# Patient Record
Sex: Female | Born: 1990 | Race: Black or African American | Hispanic: No | Marital: Single | Smoking: Current every day smoker
Health system: Southern US, Community
[De-identification: ages and names within clinical notes are randomized; demographics above are authoritative.]

## PROBLEM LIST (undated history)

## (undated) DIAGNOSIS — I10 Essential (primary) hypertension: Secondary | ICD-10-CM

## (undated) HISTORY — PX: HERNIA REPAIR: SHX51

---

## 2009-12-17 ENCOUNTER — Emergency Department (HOSPITAL_COMMUNITY): Admission: EM | Admit: 2009-12-17 | Discharge: 2009-12-17 | Payer: Self-pay | Admitting: Emergency Medicine

## 2011-02-23 ENCOUNTER — Emergency Department (HOSPITAL_COMMUNITY)
Admission: EM | Admit: 2011-02-23 | Discharge: 2011-02-23 | Disposition: A | Discharge status: Home / Self Care | Payer: Self-pay | Source: Home / Self Care

## 2011-02-23 ENCOUNTER — Encounter: Payer: Self-pay | Admitting: Cardiology

## 2011-02-23 ENCOUNTER — Encounter (HOSPITAL_COMMUNITY): Payer: Self-pay | Admitting: *Deleted

## 2011-02-23 ENCOUNTER — Inpatient Hospital Stay (HOSPITAL_COMMUNITY)
Admission: AD | Admit: 2011-02-23 | Discharge: 2011-02-24 | Disposition: A | Discharge status: Home / Self Care | Payer: Self-pay | Source: Ambulatory Visit | Attending: Obstetrics & Gynecology | Admitting: Obstetrics & Gynecology

## 2011-02-23 DIAGNOSIS — N939 Abnormal uterine and vaginal bleeding, unspecified: Secondary | ICD-10-CM

## 2011-02-23 DIAGNOSIS — N938 Other specified abnormal uterine and vaginal bleeding: Secondary | ICD-10-CM | POA: Insufficient documentation

## 2011-02-23 DIAGNOSIS — N949 Unspecified condition associated with female genital organs and menstrual cycle: Secondary | ICD-10-CM | POA: Insufficient documentation

## 2011-02-23 DIAGNOSIS — N898 Other specified noninflammatory disorders of vagina: Secondary | ICD-10-CM

## 2011-02-23 DIAGNOSIS — N920 Excessive and frequent menstruation with regular cycle: Secondary | ICD-10-CM

## 2011-02-23 HISTORY — DX: Essential (primary) hypertension: I10

## 2011-02-23 LAB — WET PREP, GENITAL
WBC, Wet Prep HPF POC: NONE SEEN
Yeast Wet Prep HPF POC: NONE SEEN

## 2011-02-23 LAB — POCT I-STAT, CHEM 8
Calcium, Ion: 1.21 mmol/L (ref 1.12–1.32)
Glucose, Bld: 94 mg/dL (ref 70–99)
HCT: 39 % (ref 36.0–46.0)
TCO2: 25 mmol/L (ref 0–100)

## 2011-02-23 LAB — POCT URINALYSIS DIP (DEVICE)
Specific Gravity, Urine: 1.025 (ref 1.005–1.030)
pH: 5.5 (ref 5.0–8.0)

## 2011-02-23 NOTE — ED Provider Notes (Signed)
History     CSN: [id number] Arrival date & time: 02/23/2011  9:09 PM   None     Chief Complaint  Patient presents with  . Vaginal Bleeding    Patient is a 20 y.o. female presenting with vaginal bleeding.  Vaginal Bleeding Associated symptoms include chills, congestion, coughing, headaches and nausea. Pertinent negatives include no abdominal pain, diaphoresis, fatigue, fever, myalgias, neck pain, numbness, rash, sore throat, vomiting or weakness.   HENREITTA SPITTLER is a 20 y.o. female who presents to [facility] for heavy vaginal bleeding that started 7 days ago. Started as a "gush" and blood clots and then got less and then today another "gush". Went to [facility] and had Hgb done and pelvic exam with cultures. Told to come to [facility] for further evaluation of the bleeding. LMP 01/06/11, no period in October. Last pap smear one year ago and was normal. Had been using implant for birth control but had it taken out last year. Last sexual intercourse September 15th. No history of STD's.  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  Past Medical History  Diagnosis Date  . Hypertension     Past Surgical History  Procedure Date  . Hernia repair     umbilical repain as infant    No family history on file.  History  Substance Use Topics  . Smoking status: Never Smoker   . Smokeless tobacco: Not on file  . Alcohol Use: Yes     occas    OB History    Grav Para Term Preterm Abortions TAB SAB Ect Mult Living   0               Review of Systems  Constitutional: Positive for chills and appetite change. Negative for fever, diaphoresis and fatigue.  HENT: Positive for congestion. Negative for ear pain, sore throat, facial swelling, neck pain, neck stiffness, dental problem and sinus pressure.   Eyes: Negative for photophobia, pain and discharge.  Respiratory: Positive for cough. Negative for chest tightness and wheezing.   Cardiovascular:       Had to wear  heart monitor last year and had a rapid heart rate and was given Rx for a medication.   Gastrointestinal: Positive for nausea and constipation. Negative for vomiting, abdominal pain, diarrhea and abdominal distention.  Genitourinary: Positive for vaginal bleeding. Negative for dysuria, frequency, flank pain, vaginal discharge, difficulty urinating and pelvic pain.  Musculoskeletal: Positive for back pain. Negative for myalgias and gait problem.  Skin: Negative for color change and rash.  Neurological: Positive for headaches. Negative for dizziness, speech difficulty, weakness, light-headedness and numbness.  Psychiatric/Behavioral: Negative for confusion and agitation. The patient is not nervous/anxious.     Allergies  Pollen extract  Home Medications  No current outpatient prescriptions on file.  BP 129/87  Pulse 82  Temp 98.7 F (37.1 C)  Resp 18  Ht 5\' 4"  (1.626 m)  Wt 262 lb (118.842 kg)  BMI 44.97 kg/m2  SpO2 99%  LMP 02/16/2011  Physical Exam  Nursing note and vitals reviewed. Constitutional: She is oriented to person, place, and time. She appears well-developed and well-nourished.  HENT:  Head: Normocephalic.  Eyes: EOM are normal.  Neck: Neck supple.  Cardiovascular: Normal rate.   Pulmonary/Chest: Effort normal.  Abdominal: Soft. There is no tenderness.  Musculoskeletal: Normal range of motion.  Neurological: She is alert and oriented to person, place, and time. No cranial nerve  deficit.  Skin: Skin is warm and dry.  Psychiatric: She has a normal mood and affect. Her behavior is normal. Judgment and thought content normal.   Results for orders placed during the hospital encounter of 02/23/11 (from the past 24 hour(s))  POCT URINALYSIS DIP (DEVICE)     Status: Abnormal   Collection Time   02/23/11  7:12 PM      Component Value Range   Glucose, UA NEGATIVE  NEGATIVE (mg/dL)   Bilirubin Urine SMALL (*) NEGATIVE    Ketones, ur TRACE (*) NEGATIVE (mg/dL)    Specific Gravity, Urine 1.025  1.005 - 1.030    Hgb urine dipstick LARGE (*) NEGATIVE    pH 5.5  5.0 - 8.0    Protein, ur 30 (*) NEGATIVE (mg/dL)   Urobilinogen, UA 1.0  0.0 - 1.0 (mg/dL)   Nitrite NEGATIVE  NEGATIVE    Leukocytes, UA NEGATIVE  NEGATIVE   POCT PREGNANCY, URINE     Status: Normal   Collection Time   02/23/11  7:20 PM      Component Value Range   Preg Test, Ur NEGATIVE    WET PREP, GENITAL     Status: Abnormal   Collection Time   02/23/11  8:12 PM      Component Value Range   Yeast, Wet Prep NONE SEEN  NONE SEEN    Trich, Wet Prep NONE SEEN  NONE SEEN    Clue Cells, Wet Prep FEW (*) NONE SEEN    WBC, Wet Prep HPF POC NONE SEEN  NONE SEEN   POCT I-STAT, CHEM 8     Status: Abnormal   Collection Time   02/23/11  8:20 PM      Component Value Range   Sodium 141  135 - 145 (mEq/L)   Potassium 3.4 (*) 3.5 - 5.1 (mEq/L)   Chloride 104  96 - 112 (mEq/L)   BUN 6  6 - 23 (mg/dL)   Creatinine, Ser 1.61  0.50 - 1.10 (mg/dL)   Glucose, Bld 94  70 - 99 (mg/dL)   Calcium, Ion 0.96  0.45 - 1.32 (mmol/L)   TCO2 25  0 - 100 (mmol/L)   Hemoglobin 13.3  12.0 - 15.0 (g/dL)   HCT 40.9  81.1 - 91.4 (%)   ED Course  Procedures (including critical care time) US Transvaginal Non-ob  02/24/2011  *RADIOLOGY REPORT*  Clinical Data: 20 year old female with pelvic pain and heavy bleeding.  TRANSABDOMINAL AND TRANSVAGINAL ULTRASOUND OF PELVIS Technique:  Both transabdominal and transvaginal ultrasound examinations of the pelvis were performed. Transabdominal technique was performed for global imaging of the pelvis including uterus, ovaries, adnexal regions, and pelvic cul-de-sac.  Comparison: None.   It was necessary to proceed with endovaginal exam following the transabdominal exam to visualize the uterus and adnexa.  Findings:  Uterus: Normal measuring 7.1 x 3.5 x 4.3 cm.  Endometrium: Mildly heterogeneous echotexture without discrete mass or lesion, measuring up to 15 mm in thickness.  No  hypervascularity on power Doppler interrogation.  Right ovary:  2.9 x 1.9 x 1.8 cm.  Irregular largely anechoic focus without vascularity measuring 2.7 x 0.8 x 1.7 cm likely represents a physiologic cyst.  Left ovary: Normal with small follicles.  2.8 x 1.1 x 2.0 cm.  Other findings: No pelvic free fluid.  IMPRESSION: Negative except for mildly heterogeneous echotexture of the endometrium which measures up to 15 mm in thickness.  Original Report Authenticated By: Harley Hallmark, M.D.   US Pelvis  Complete  02/24/2011  *RADIOLOGY REPORT*  Clinical Data: 20 year old female with pelvic pain and heavy bleeding.  TRANSABDOMINAL AND TRANSVAGINAL ULTRASOUND OF PELVIS Technique:  Both transabdominal and transvaginal ultrasound examinations of the pelvis were performed. Transabdominal technique was performed for global imaging of the pelvis including uterus, ovaries, adnexal regions, and pelvic cul-de-sac.  Comparison: None.   It was necessary to proceed with endovaginal exam following the transabdominal exam to visualize the uterus and adnexa.  Findings:  Uterus: Normal measuring 7.1 x 3.5 x 4.3 cm.  Endometrium: Mildly heterogeneous echotexture without discrete mass or lesion, measuring up to 15 mm in thickness.  No hypervascularity on power Doppler interrogation.  Right ovary:  2.9 x 1.9 x 1.8 cm.  Irregular largely anechoic focus without vascularity measuring 2.7 x 0.8 x 1.7 cm likely represents a physiologic cyst.  Left ovary: Normal with small follicles.  2.8 x 1.1 x 2.0 cm.  Other findings: No pelvic free fluid.  IMPRESSION: Negative except for mildly heterogeneous echotexture of the endometrium which measures up to 15 mm in thickness.  Original Report Authenticated By: Harley Hallmark, M.D.   Consult with Dr.Leggett  Assessment: Dysfunctional uterine bleeding  Plan:  Discussed results of lab and ultrasound in detail with the patient and options of birth control.   Depo Provera 150 mg. IM   Follow up in 3  months  MDM          Kerrie Buffalo, NP 02/24/11 0200

## 2011-02-23 NOTE — ED Notes (Signed)
Pt advised to go to [facility]. Pt verbalizes understanding.

## 2011-02-23 NOTE — Progress Notes (Signed)
Pt sent over from [facility] urgent care, presented there with vaginal bleeding x 1 week, pt states she will have heavy bleeding then spotting. Had another episode of heavy bleeding today and presented to be seen. Off/on cramping. LMP in sept, has history of irregular periods, never been pregnant, no birth control

## 2011-02-23 NOTE — Progress Notes (Signed)
Pt states she has had on/off heavy bleeding for a couple of weeks.Pt states she bleeds "really heavy for about a day then starts to spott"  Started bleeding heavy today changed peri pad x1 today. New pad placed, no active bleeding at this time.

## 2011-02-23 NOTE — ED Provider Notes (Signed)
History     CSN: [id number] Arrival date & time: 02/23/2011  6:57 PM   None     Chief Complaint  Patient presents with  . Vaginal Bleeding    (Consider location/radiation/quality/duration/timing/severity/associated sxs/prior treatment) HPI Comments: Pt states she has a hx of irregular menses. LNMP was sometime in Sept then had intermittent spotting in Oct. One week ago while getting out of the shower suddenly began menstruating, and was heavy - running down her legs. Since then she only had light bleeding off & on until today. This afternoon has been heavy again soaking through tampon and pad sometimes in as little as a few minutes.   Patient is a 20 y.o. female presenting with vaginal bleeding.  Vaginal Bleeding This is a new problem. The current episode started more than 2 days ago. The problem occurs constantly. The problem has been rapidly worsening. Associated symptoms include headaches (intermittent x approx 10 mos, not assoc with menses). Pertinent negatives include no chest pain, no abdominal pain and no shortness of breath. The symptoms are aggravated by nothing. The symptoms are relieved by nothing. She has tried nothing for the symptoms.    Past Medical History  Diagnosis Date  . Hypertension     Past Surgical History  Procedure Date  . Hernia repair     umbilical repain as infant    No family history on file.  History  Substance Use Topics  . Smoking status: Never Smoker   . Smokeless tobacco: Not on file  . Alcohol Use: Yes     occas    OB History    Grav Para Term Preterm Abortions TAB SAB Ect Mult Living                  Review of Systems  Constitutional: Negative for fever, chills and fatigue.  Respiratory: Negative for chest tightness and shortness of breath.   Cardiovascular: Negative for chest pain and palpitations.  Gastrointestinal: Negative for nausea, vomiting and abdominal pain.  Genitourinary: Positive for vaginal bleeding. Negative for  vaginal discharge and vaginal pain.  Neurological: Positive for headaches (intermittent x approx 10 mos, not assoc with menses). Negative for light-headedness.    Allergies  Pollen extract  Home Medications   Current Outpatient Rx  Name Route Sig Dispense Refill  . FLUTICASONE PROPIONATE 50 MCG/ACT NA SUSP Nasal Place 2 sprays into the nose as needed.      Marland Kitchen LORATADINE 10 MG PO TABS Oral Take 10 mg by mouth as needed.        BP 156/88  Pulse 86  Temp(Src) 98.8 F (37.1 C) (Oral)  Resp 18  SpO2 100%  LMP 02/16/2011  Physical Exam  Nursing note and vitals reviewed. Constitutional: She appears well-developed and well-nourished. No distress.  HENT:  Head: Normocephalic and atraumatic.  Right Ear: Tympanic membrane, external ear and ear canal normal.  Left Ear: Tympanic membrane, external ear and ear canal normal.  Nose: Nose normal.  Mouth/Throat: Uvula is midline, oropharynx is clear and moist and mucous membranes are normal. No oropharyngeal exudate, posterior oropharyngeal edema or posterior oropharyngeal erythema.  Neck: Neck supple.  Cardiovascular: Normal rate, regular rhythm and normal heart sounds.   Pulmonary/Chest: Effort normal and breath sounds normal. No respiratory distress.  Genitourinary: Uterus normal. There is no rash, tenderness or lesion on the right labia. There is no rash, tenderness or lesion on the left labia. Cervix exhibits no motion tenderness, no discharge and no friability. Right adnexum displays no  mass, no tenderness and no fullness. Left adnexum displays no mass, no tenderness and no fullness. There is bleeding (moderate amount of blood noted in vagina, external genitalia and medial thighs, no blood from cervical os during  speculum exam) around the vagina. No erythema or tenderness around the vagina. No signs of injury around the vagina. No vaginal discharge found.  Lymphadenopathy:    She has no cervical adenopathy.  Neurological: She is alert.    Skin: Skin is warm and dry.  Psychiatric: She has a normal mood and affect.    ED Course  Procedures (including critical care time)  Labs Reviewed  POCT URINALYSIS DIP (DEVICE) - Abnormal; Notable for the following:    Bilirubin Urine SMALL (*)    Ketones, ur TRACE (*)    Hgb urine dipstick LARGE (*)    Protein, ur 30 (*)    All other components within normal limits  POCT PREGNANCY, URINE  POCT PREGNANCY, URINE  POCT URINALYSIS DIPSTICK  GC/CHLAMYDIA PROBE AMP, GENITAL  WET PREP, GENITAL  I-STAT, CHEM 8   No results found.   No diagnosis found.    MDM  Labs reviewed. Pt to go to [facility] for further eval.         Melody Comas, [place] 02/23/11 2040

## 2011-02-23 NOTE — ED Notes (Signed)
Reports vaginal bleeding that started one week ago. Sudden on onset. Has been spotting since that episode. Also states episodes of heavy bleeding where she saturates a tampon and leaks also clothing. Pt also reports chest pressure with extreme burping. Pt has also had episode 1 1/2 weeks ago with extreme nausea. Poor appetite for 4 days. Relieved with saltines and ginger ale but still has no appetite. Pt reporting headaches frequent in nature over the since February. She thinks she needs new glasses.

## 2011-02-23 NOTE — ED Notes (Signed)
Pt reports bilat breast soreness. She also notes that her periods have been irregular.

## 2011-02-24 ENCOUNTER — Inpatient Hospital Stay (HOSPITAL_COMMUNITY): Payer: Self-pay

## 2011-02-24 LAB — GC/CHLAMYDIA PROBE AMP, GENITAL: Chlamydia, DNA Probe: NEGATIVE

## 2011-02-24 MED ORDER — MEDROXYPROGESTERONE ACETATE 150 MG/ML IM SUSP
150.0000 mg | Freq: Once | INTRAMUSCULAR | Status: AC
  Administered 2011-02-24: 150 mg via INTRAMUSCULAR
  Filled 2011-02-24: qty 1

## 2011-02-26 NOTE — ED Provider Notes (Signed)
Agree with above note.  Amy Brooks H. 02/26/2011 12:59 PM

## 2012-03-15 IMAGING — US US TRANSVAGINAL NON-OB
1 series · 14 of 25 positions shown · non-contrast
Comparison: None.

CLINICAL DATA: 20-year-old female with pelvic pain and heavy
bleeding.

TRANSABDOMINAL AND TRANSVAGINAL ULTRASOUND OF PELVIS
TECHNIQUE: Both transabdominal and transvaginal ultrasound
examinations of the pelvis were performed. Transabdominal technique
was performed for global imaging of the pelvis including uterus,
ovaries, adnexal regions, and pelvic cul-de-sac.

[Series 1: us pelvis complete · 14 of 32 slices shown]
[im 1/32]
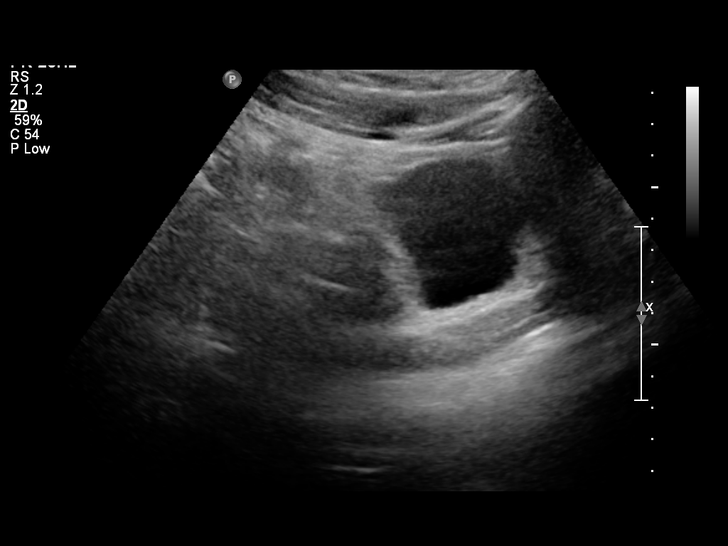
[im 3/32]
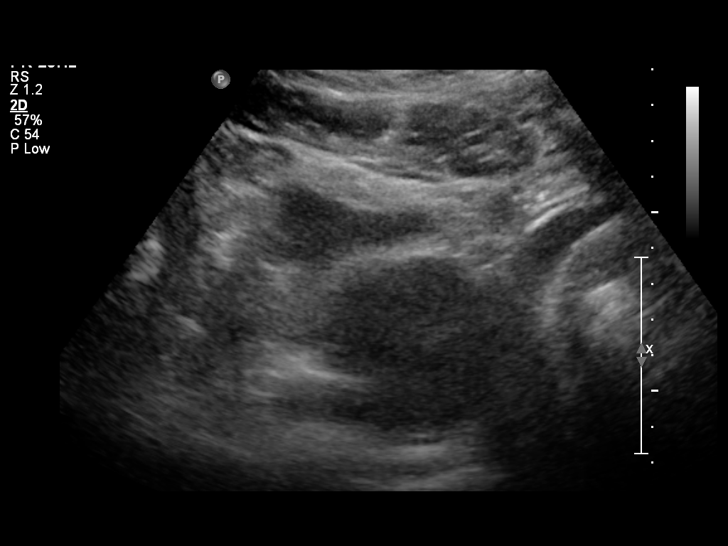
[im 6/32]
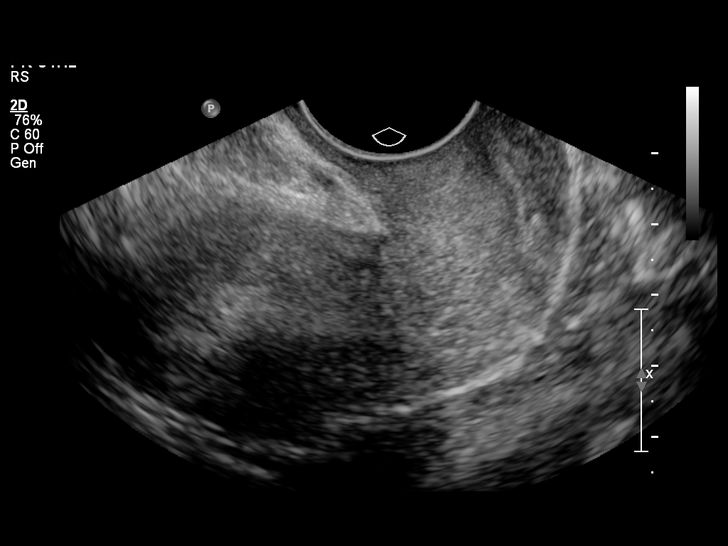
[im 8/32]
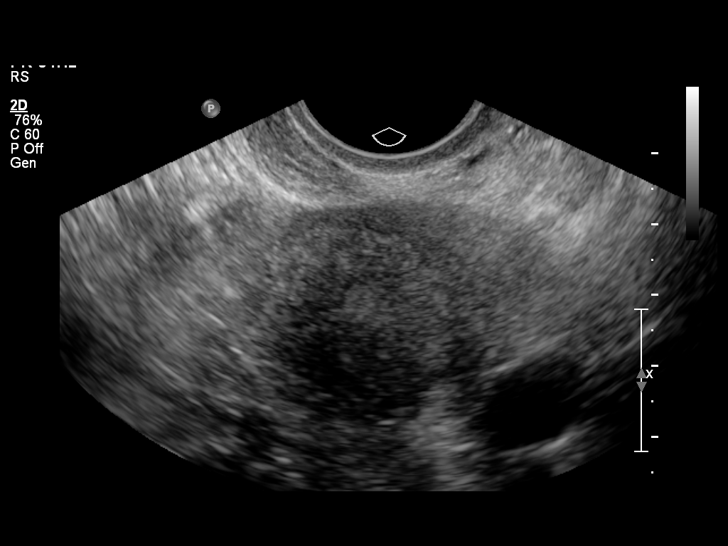
[im 11/32]
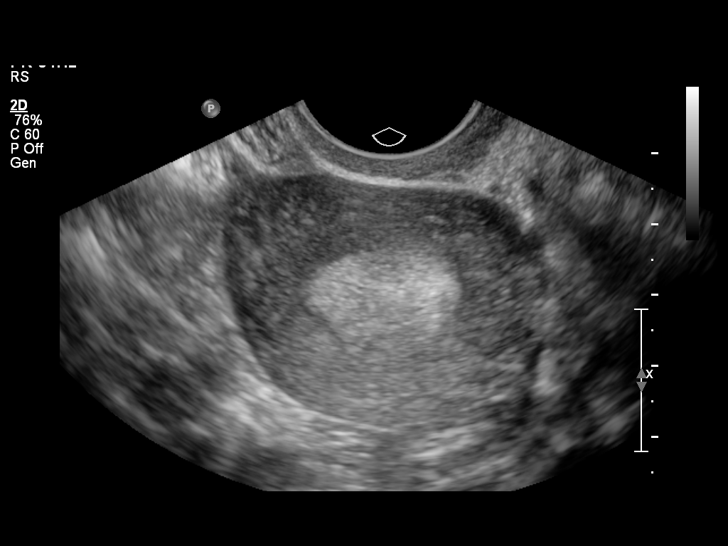
[im 12/32]
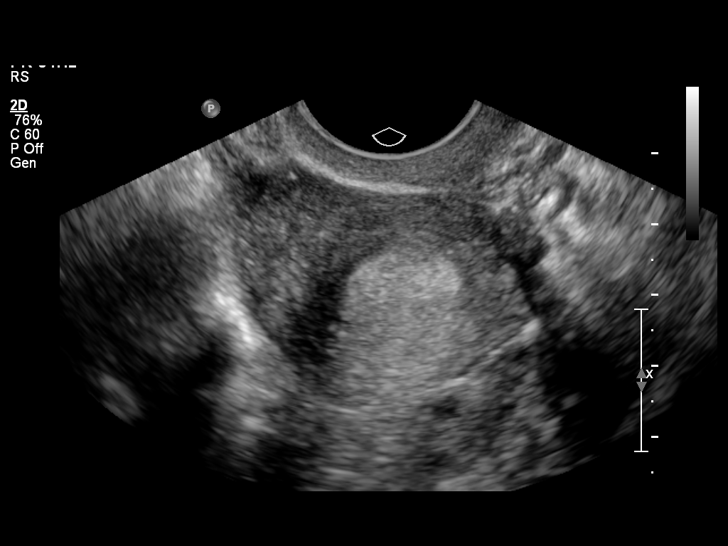
[im 15/32]
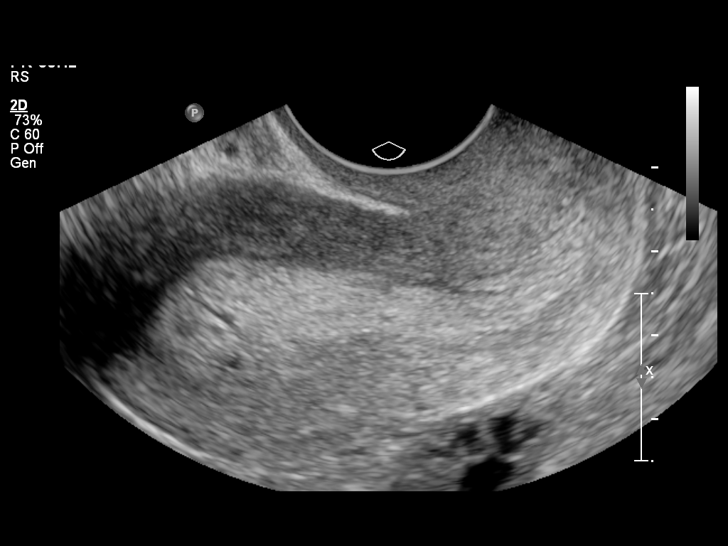
[im 17/32]
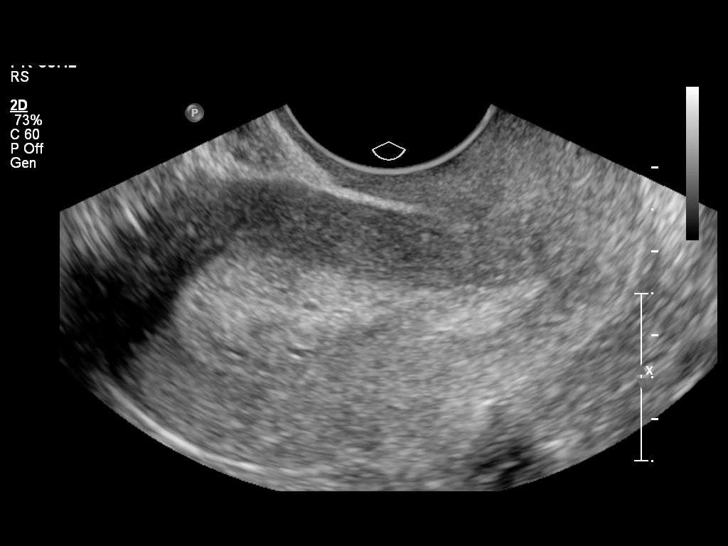
[im 20/32]
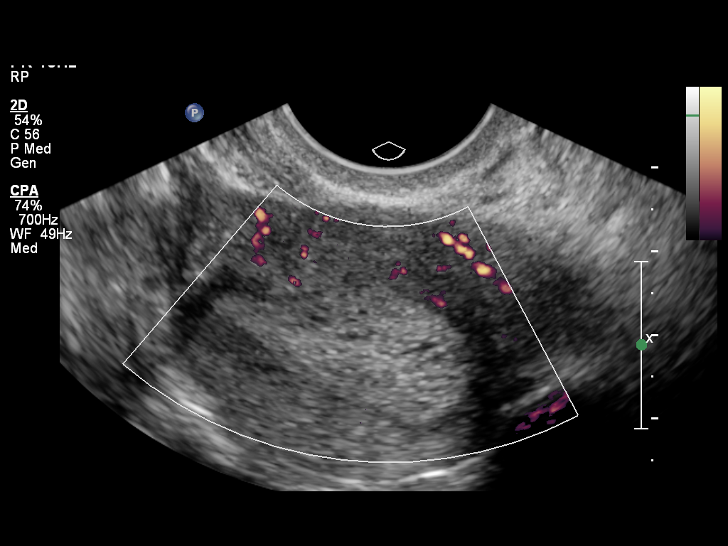
[im 21/32]
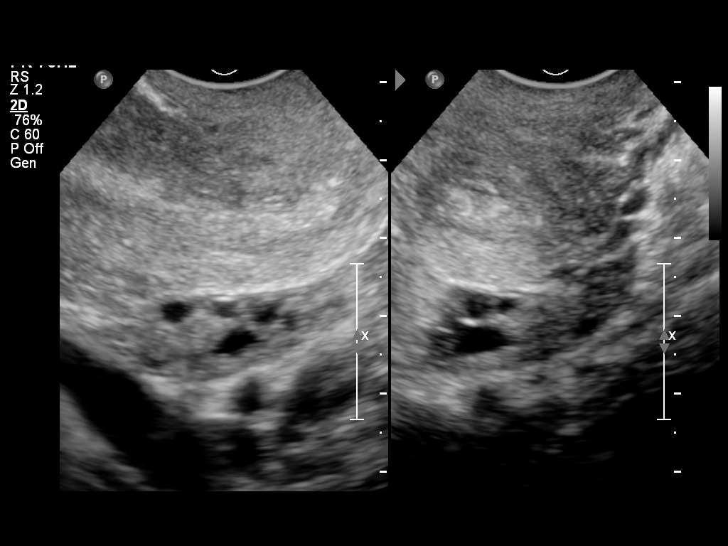
[im 24/32]
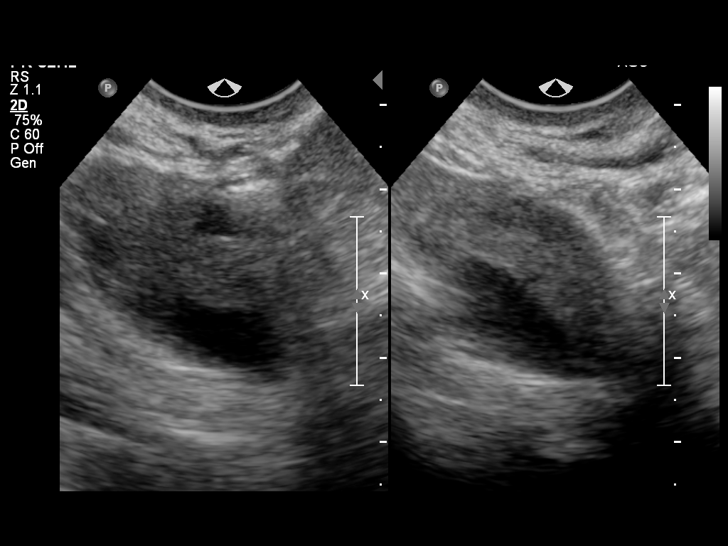
[im 26/32]
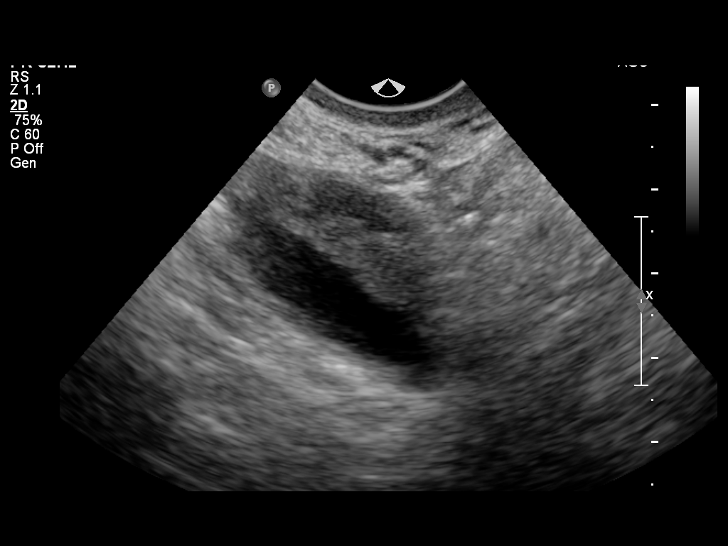
[im 29/32]
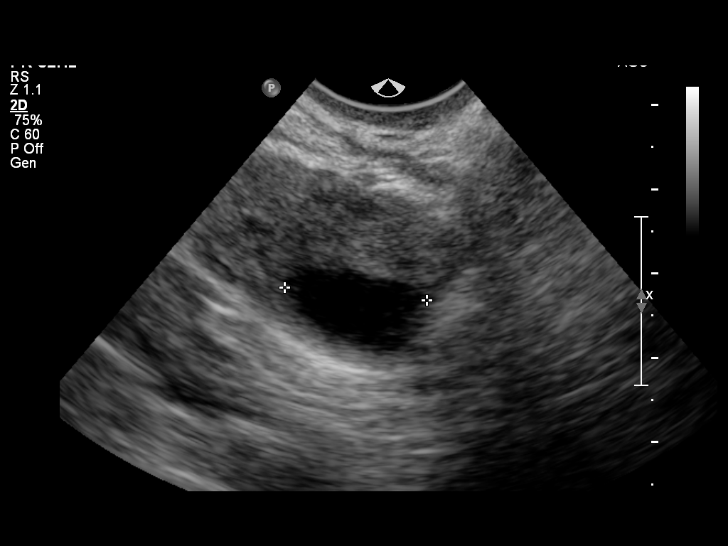
[im 32/32]
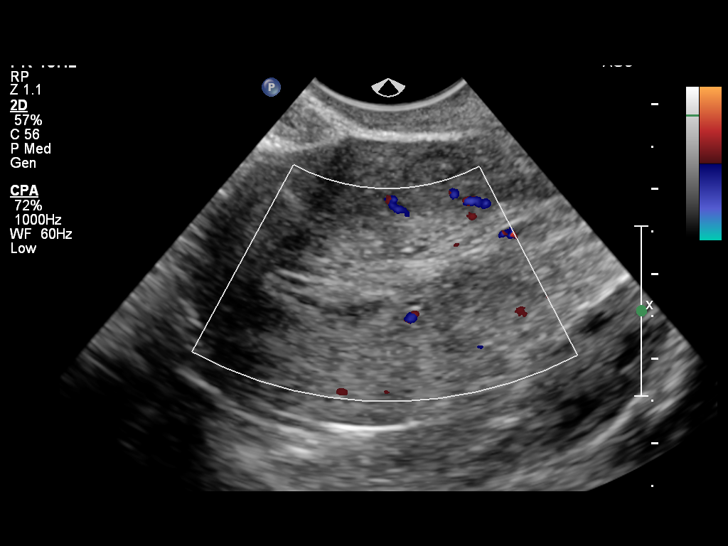

[14 of 25 positions shown; findings below may reference images not displayed]

It was necessary to proceed with endovaginal exam following the
transabdominal exam to visualize the uterus and adnexa.
FINDINGS: Uterus: Normal measuring 7.1 x 3.5 x 4.3 cm.

Endometrium: Mildly heterogeneous echotexture without discrete mass
or lesion, measuring up to 15 mm in thickness.  No hypervascularity
on power Doppler interrogation.

Right ovary:  2.9 x 1.9 x 1.8 cm.  Irregular largely anechoic focus
without vascularity measuring 2.7 x 0.8 x 1.7 cm likely represents
a physiologic cyst.

Left ovary: Normal with small follicles.  2.8 x 1.1 x 2.0 cm.

Other findings: No pelvic free fluid.
IMPRESSION: Negative except for mildly heterogeneous echotexture of the
endometrium which measures up to 15 mm in thickness.

## 2016-09-04 ENCOUNTER — Encounter: Payer: Self-pay | Admitting: Allergy & Immunology

## 2016-09-04 ENCOUNTER — Ambulatory Visit (INDEPENDENT_AMBULATORY_CARE_PROVIDER_SITE_OTHER): Payer: Self-pay | Admitting: Allergy & Immunology

## 2016-09-04 VITALS — BP 128/80 | HR 100 | Temp 98.1°F | Resp 18 | Ht 64.0 in | Wt 225.4 lb

## 2016-09-04 DIAGNOSIS — R21 Rash and other nonspecific skin eruption: Secondary | ICD-10-CM

## 2016-09-04 DIAGNOSIS — I10 Essential (primary) hypertension: Secondary | ICD-10-CM | POA: Insufficient documentation

## 2016-09-04 DIAGNOSIS — J310 Chronic rhinitis: Secondary | ICD-10-CM

## 2016-09-04 DIAGNOSIS — J301 Allergic rhinitis due to pollen: Secondary | ICD-10-CM

## 2016-09-04 MED ORDER — FLUTICASONE PROPIONATE 50 MCG/ACT NA SUSP: 2.0000 | Freq: Every day | NASAL | 5 refills | Status: AC | PRN

## 2016-09-04 MED ORDER — CETIRIZINE HCL 10 MG PO TABS: 10.0000 mg | ORAL_TABLET | Freq: Every day | ORAL | 5 refills | Status: AC

## 2016-09-04 MED ORDER — AZELASTINE HCL 0.1 % NA SOLN: 2.0000 | Freq: Every day | NASAL | 5 refills | Status: AC | PRN

## 2016-09-04 MED ORDER — OLOPATADINE HCL 0.2 % OP SOLN: 1.0000 [drp] | OPHTHALMIC | 5 refills | Status: AC

## 2016-09-04 MED ORDER — TRIAMCINOLONE ACETONIDE 0.1 % EX OINT: 1.0000 "application " | TOPICAL_OINTMENT | Freq: Two times a day (BID) | CUTANEOUS | 0 refills | Status: AC

## 2016-09-04 NOTE — Progress Notes (Signed)
NEW PATIENT  Date of Service/Encounter:  09/04/16  Referring provider: Dr. Morey Hummingbird, DO   Assessment:   Non-seasonal allergic rhinitis (trees, weeds, grasses, molds, dust mites, cat and cockroach)  Rash  Plan/Recommendations:    1. Chronic rhinitis - Testing today showed: trees, weeds, grasses, molds, dust mites, cat and cockroach - Avoidance measures provided. - Stop the Claritin daily.  - Start Zyrtec (cetirizine) 56m tablet once daily, Flonase (fluticasone) two sprays per nostril daily, Astelin (azelastine) 2 sprays per nostril 1-2 times daily as needed and Pataday (olopatadine) one drop per eye twice daily as needed. - You can take an extra Zyrtec (cetirizine) on particularly bad days.  - Consider allergy shots as a means of long-term control. - Allergy shots "re-train" the immune system to ignore environmental allergens and decrease the resulting immune response to those allergens.  - We can discuss more at the next appointment if the medications are not working for you.  2. Rash - I am unsure what is causing the rash, but we will send in a prescription for triamcinolone to use as needed to see if this helps. - Watch the moles on the feet to see if they change. - Concerning features for melanoma include the ABCDEs: asymmetry (one half is unlike the other), borders (irregular), color (variable), diameter (>631m, evolving (increase in size over time)  3. Return in about 3 months (around 12/05/2016).   Subjective:   Amy PEDERSONs a [age].o. female presenting today for evaluation of  Chief Complaint  Patient presents with  . Allergic Rhinitis     veyr bad seasonal allergies and will have laryngitis for a week    Amy Brooks has a history of the following: Patient Active Problem List   Diagnosis Date Noted  . Non-seasonal allergic rhinitis due to pollen 09/04/2016  . Rash 09/04/2016  . Essential hypertension 09/04/2016    History obtained from: chart  review and patient.  Amy Stallionas referred by Patient, No Pcp Per.     Amy Brooks a [age].o. female presenting for an allergy evaluation. She reports that she has had symptoms for years during the pollen season. However over the winter she lost her voice and is concerned that she developed a new allergy. Typically she is free from symptoms in the winter. [place] Claritin throughout the allergy season. She has not been on a nose spray recently. She may have been on Singulair in the distant past. Animals do not seem to make it worse but dust seems to make her nose "tickle". Symptoms include itchy watery eyes as well as nasal congestion and postnasal drip.   She was tested when she was 13 years. She was allergic to trees and molds. She never had any shots at all. She does have a rash on both of her feet. This is not pruritic at all. She does wear socks with her shoes. It is not painful. She has had it for a number of years. She also reports moles on the bottom of her feet, unsure of the duration.    She has no history of asthma or food allergies. Otherwise, there is no history of other atopic diseases, including asthma, drug allergies, food allergies, stinging insect allergies, or urticaria. There is no significant infectious history. Vaccinations are up to date. She does have essential hypertension and is on amlodipine 1018mnce daily.    Past Medical  History: Patient Active Problem List   Diagnosis Date Noted  . Non-seasonal allergic rhinitis due to pollen 09/04/2016  . Rash 09/04/2016  . Essential hypertension 09/04/2016    Medication List:  Allergies as of 09/04/2016      Reactions   Pollen Extract Itching   Patient states that she develops itchy eyes.      Medication List       Accurate as of 09/04/16 12:42 PM. Always use your most recent med list.          amLODipine 10 MG tablet Commonly known as:  NORVASC Take 10 mg by  mouth daily.   fluticasone 50 MCG/ACT nasal spray Commonly known as:  FLONASE Place 2 sprays into the nose as needed.   ibuprofen 200 MG tablet Commonly known as:  ADVIL,MOTRIN Take 400 mg by mouth every 6 (six) hours as needed. Patient used this medication for pain.   loratadine 10 MG tablet Commonly known as:  CLARITIN Take 10 mg by mouth as needed.   naproxen 500 MG tablet Commonly known as:  NAPROSYN Take 500 mg by mouth 2 (two) times daily with a meal.        Birth History: non-contributory. Born premature without complications. She was breech and born via c/s. She is unsure whether she was in the NICU.   Developmental History: Amy Brooks has met all milestones on time. She has required no speech therapy, occupational therapy, or physical therapy.   Past Surgical History: Past Surgical History:  Procedure Laterality Date  . HERNIA REPAIR     umbilical repain as infant     Family History: Family History  Problem Relation Age of Onset  . Allergic rhinitis Neg Hx   . Asthma Neg Hx   . Angioedema Neg Hx   . Eczema Neg Hx   . Immunodeficiency Neg Hx   . Urticaria Neg Hx    Her father does have a history of allergies.    Social History: Amy Brooks lives at home by herself. There are neighbors with dogs. She is a smoker but has been decreasing her use. She smokes around one pack per week. She has been a smoker in 2014. She is currently working with preschoolers. She lives in an apartment of unknown age. There is carpeting throughout the apartment. She has electric heating and window units for cooling. She does not use dust mite covers. She recently moved here from [place].    Review of Systems: a 14-point review of systems is pertinent for what is mentioned in HPI.  Otherwise, all other systems were negative. Constitutional: negative other than that listed in the HPI Eyes: negative other than that listed in the HPI Ears, nose, mouth, throat, and face: negative other than that  listed in the HPI Respiratory: negative other than that listed in the HPI Cardiovascular: negative other than that listed in the HPI Gastrointestinal: negative other than that listed in the HPI Genitourinary: negative other than that listed in the HPI Integument: negative other than that listed in the HPI Hematologic: negative other than that listed in the HPI Musculoskeletal: negative other than that listed in the HPI Neurological: negative other than that listed in the HPI Allergy/Immunologic: negative other than that listed in the HPI    Objective:   Blood pressure 128/80, pulse 100, temperature 98.1 F (36.7 C), temperature source Oral, resp. rate 18, height _0  (1.626 m), weight 225 lb 6.4 oz (102.2 kg), SpO2 96 %. Body mass index is 38.69 kg/m.  Physical Exam:  General: Alert, interactive, in no acute distress. Laughing throughout the exam.  Eyes: No conjunctival injection present on the right, No conjunctival injection present on the left, PERRL bilaterally, No discharge on the right, No discharge on the left, No Horner-Trantas dots present and allergic shiners present bilaterally Ears: Left TM pearly gray with normal light reflex, Left TM intact without perforation and Right TM unable to be visualized due to cerumen impaction.  Nose/Throat: External nose within normal limits, nasal crease present and septum midline, turbinates markedly edematous and pale without discharge, post-pharynx erythematous with cobblestoning in the posterior oropharynx. Tonsils 2+ without exudates Neck: Supple without thyromegaly.  Adenopathy: no enlarged lymph nodes appreciated in the anterior cervical, occipital, axillary, epitrochlear, inguinal, or popliteal regions Lungs: Clear to auscultation without wheezing, rhonchi or rales. No increased work of breathing. CV: Normal S1/S2, no murmurs. Capillary refill <2 seconds.  Abdomen: Nondistended, nontender. No guarding or rebound tenderness. Bowel  sounds present in all fields and hyperactive  Skin: Warm and dry, without lesions or rashes. There are hyperkeratotic lesions on the inside of both feet with excoriations present. There are a couple of moles on the bottoms of her feet.  Extremities:  No clubbing, cyanosis or edema. Neuro:   Grossly intact. No focal deficits appreciated. Responsive to questions.  Diagnostic studies:   Allergy Studies:   Indoor/Outdoor Percutaneous Adult Environmental Panel: positive to bahia grass, [place] grass, johnson grass, [place] blue grass, meadow fescue grass, perennial rye grass, sweet vernal grass, timothy grass, burweed marsh elder, short ragweed, lamb's quarters, sheep sorrel, rough pigweed, common mugwort, ash, birch, American beech, Box elder, red cedar, elm, hickory, maple, oak, pine, Eastern sycamore and black walnut pollen. Otherwise negative with adequate controls.  Indoor/Outdoor Selected Intradermal Environmental Panel: positive to mold mix #2, mold mix #3, mold mix #4, cat, cockroach and mite mix. Otherwise negative with adequate controls.    Salvatore Marvel, MD [facility]

## 2016-09-04 NOTE — Patient Instructions (Addendum)
1. Chronic rhinitis - Testing today showed: trees, weeds, grasses, molds, dust mites, cat and cockroach - Avoidance measures provided. - Stop the Claritin daily.  - Start Zyrtec (cetirizine) 10mg  tablet once daily, Flonase (fluticasone) two sprays per nostril daily, Astelin (azelastine) 2 sprays per nostril 1-2 times daily as needed and Pataday (olopatadine) one drop per eye twice daily as needed. - You can take an extra Zyrtec (cetirizine) on particularly bad days.  - Consider allergy shots as a means of long-term control. - Allergy shots "re-train" the immune system to ignore environmental allergens and decrease the resulting immune response to those allergens.  - We can discuss more at the next appointment if the medications are not working for you.  2. Rash - I am unsure what is causing the rash, but we will send in a prescription for triamcinolone to use as needed to see if this helps. - Watch the moles on the feet to see if they change. - Concerning features for melanoma include the ABCDEs: asymmetry (one half is unlike the other), borders (irregular), color (variable), diameter (>456mm), evolving (increase in size over time)  3. Return in about 3 months (around 12/05/2016).  Please inform us of any Emergency Department visits, hospitalizations, or changes in symptoms. Call us before going to the ED for breathing or allergy symptoms since we might be able to fit you in for a sick visit. Feel free to contact us anytime with any questions, problems, or concerns.  It was a pleasure to meet you today! Happy spring!   Websites that have reliable patient information: 1. American Academy of Asthma, Allergy, and Immunology: www.aaaai.org 2. Food Allergy Research and Education (FARE): foodallergy.org 3. Mothers of Asthmatics: http://www.asthmacommunitynetwork.org 4. American College of Allergy, Asthma, and Immunology: www.acaai.org   Reducing Pollen Exposure  The American Academy of Allergy,  Asthma and Immunology suggests the following steps to reduce your exposure to pollen during allergy seasons.    1. Do not hang sheets or clothing out to dry; pollen may collect on these items. 2. Do not mow lawns or spend time around freshly cut grass; mowing stirs up pollen. 3. Keep windows closed at night.  Keep car windows closed while driving. 4. Minimize morning activities outdoors, a time when pollen counts are usually at their highest. 5. Stay indoors as much as possible when pollen counts or humidity is high and on windy days when pollen tends to remain in the air longer. 6. Use air conditioning when possible.  Many air conditioners have filters that trap the pollen spores. 7. Use a HEPA room air filter to remove pollen form the indoor air you breathe.  Control of Mold Allergen  Mold and fungi can grow on a variety of surfaces provided certain temperature and moisture conditions exist.  Outdoor molds grow on plants, decaying vegetation and soil.  The major outdoor mold, Alternaria and Cladosporium, are found in very high numbers during hot and dry conditions.  Generally, a late Summer - Fall peak is seen for common outdoor fungal spores.  Rain will temporarily lower outdoor mold spore count, but counts rise rapidly when the rainy period ends.  The most important indoor molds are Aspergillus and Penicillium.  Dark, humid and poorly ventilated basements are ideal sites for mold growth.  The next most common sites of mold growth are the bathroom and the kitchen.  Outdoor MicrosoftMold Control 1. Use air conditioning and keep windows closed 2. Avoid exposure to decaying vegetation. 3. Avoid leaf raking. 4.  Avoid grain handling. 5. Consider wearing a face mask if working in moldy areas.  Indoor Mold Control 1. Maintain humidity below 50%. 2. Clean washable surfaces with 5% bleach solution. 3. Remove sources e.g. contaminated carpets.  Control of House Dust Mite Allergen    House dust mites  play a major role in allergic asthma and rhinitis.  They occur in environments with high humidity wherever human skin, the food for dust mites is found. High levels have been detected in dust obtained from mattresses, pillows, carpets, upholstered furniture, bed covers, clothes and soft toys.  The principal allergen of the house dust mite is found in its feces.  A gram of dust may contain 1,000 mites and 250,000 fecal particles.  Mite antigen is easily measured in the air during house cleaning activities.    1. Encase mattresses, including the box spring, and pillow, in an air tight cover.  Seal the zipper end of the encased mattresses with wide adhesive tape. 2. Wash the bedding in water of 130 degrees Farenheit weekly.  Avoid cotton comforters/quilts and flannel bedding: the most ideal bed covering is the dacron comforter. 3. Remove all upholstered furniture from the bedroom. 4. Remove carpets, carpet padding, rugs, and non-washable window drapes from the bedroom.  Wash drapes weekly or use plastic window coverings. 5. Remove all non-washable stuffed toys from the bedroom.  Wash stuffed toys weekly. 6. Have the room cleaned frequently with a vacuum cleaner and a damp dust-mop.  The patient should not be in a room which is being cleaned and should wait 1 hour after cleaning before going into the room. 7. Close and seal all heating outlets in the bedroom.  Otherwise, the room will become filled with dust-laden air.  An electric heater can be used to heat the room. 8. Reduce indoor humidity to less than 50%.  Do not use a humidifier.  Control of Dog or Cat Allergen  Avoidance is the best way to manage a dog or cat allergy. If you have a dog or cat and are allergic to dog or cats, consider removing the dog or cat from the home. If you have a dog or cat but don't want to find it a new home, or if your family wants a pet even though someone in the household is allergic, here are some strategies that may  help keep symptoms at bay:  1. Keep the pet out of your bedroom and restrict it to only a few rooms. Be advised that keeping the dog or cat in only one room will not limit the allergens to that room. 2. Don't pet, hug or kiss the dog or cat; if you do, wash your hands with soap and water. 3. High-efficiency particulate air (HEPA) cleaners run continuously in a bedroom or living room can reduce allergen levels over time. 4. Regular use of a high-efficiency vacuum cleaner or a central vacuum can reduce allergen levels. 5. Giving your dog or cat a bath at least once a week can reduce airborne allergen.  Control of Cockroach Allergen  Cockroach allergen has been identified as an important cause of acute attacks of asthma, especially in urban settings.  There are fifty-five species of cockroach that exist in the [place], however only three, the [place], [place] species produce allergen that can affect patients with Asthma.  Allergens can be obtained from fecal particles, egg casings and secretions from cockroaches.    1. Remove food sources. 2. Reduce access to water. 3.  Seal access and entry points. 4. Spray runways with 0.5-1% Diazinon or Chlorpyrifos 5. Blow boric acid power under stoves and refrigerator. 6. Place bait stations (hydramethylnon) at feeding sites.
